# Patient Record
Sex: Female | Born: 1995 | Hispanic: No | Marital: Single | Smoking: Never smoker
Health system: Southern US, Community
[De-identification: ages and names within clinical notes are randomized; demographics above are authoritative.]

## PROBLEM LIST (undated history)

## (undated) HISTORY — PX: MOUTH SURGERY: SHX715

---
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
## 2016-05-16 ENCOUNTER — Emergency Department (HOSPITAL_COMMUNITY)
Admission: EM | Admit: 2016-05-16 | Discharge: 2016-05-17 | Disposition: A | Discharge status: Home / Self Care | Payer: Medicaid Other | Attending: Emergency Medicine | Admitting: Emergency Medicine

## 2016-05-16 ENCOUNTER — Encounter (HOSPITAL_COMMUNITY): Payer: Self-pay | Admitting: *Deleted

## 2016-05-16 DIAGNOSIS — R110 Nausea: Secondary | ICD-10-CM | POA: Insufficient documentation

## 2016-05-16 DIAGNOSIS — R519 Headache, unspecified: Secondary | ICD-10-CM

## 2016-05-16 DIAGNOSIS — R51 Headache: Secondary | ICD-10-CM | POA: Diagnosis present

## 2016-05-16 DIAGNOSIS — Z79899 Other long term (current) drug therapy: Secondary | ICD-10-CM | POA: Diagnosis not present

## 2016-05-16 MED ORDER — ONDANSETRON HCL 4 MG/2ML IJ SOLN
4.0000 mg | Freq: Once | INTRAMUSCULAR | Status: AC
  Administered 2016-05-17: 4 mg via INTRAVENOUS
  Filled 2016-05-16: qty 2

## 2016-05-16 MED ORDER — SODIUM CHLORIDE 0.9 % IV BOLUS (SEPSIS)
1000.0000 mL | Freq: Once | INTRAVENOUS | Status: AC
  Administered 2016-05-17: 1000 mL via INTRAVENOUS

## 2016-05-16 NOTE — ED Notes (Signed)
Pt complains of a frontal headache that started about 2 weeks ago. Had nausea yesterday. Denies blurry vision, dizziness, or vomiting. Took Tylenol yesterday for pain but has not helped.

## 2016-05-16 NOTE — ED Notes (Signed)
Per pt report: pt presents to the ED with c/o a headache x 2 weeks.  Pt reports the headaches come suddenly and the pain mainly stays in the top middle of her forehead.  Pt reports some nausea a few days ago but not at this time. Pt has some light sensitively. Pt rates pain 10 out of 10.

## 2016-05-16 NOTE — ED Provider Notes (Signed)
CSN: [id number]     Arrival date & time 05/16/16  2318 History  By signing my name below, I, Levon HedgerElizabeth Hall, attest that this documentation has been prepared under the direction and in the presence of non-physician practitioner, Dorthula Matasiffany G Lamon Rotundo, PA-C Electronically Signed: Levon HedgerElizabeth Hall, Scribe. 05/17/2016. 12:28 AM.    Chief Complaint  Patient presents with  . Headache   The history is provided by the patient. No language interpreter was used.  HPI Comments:  Paula Rangel is a 20 y.o. female who presents to the Emergency Department complaining of sudden onset, intermittent, sharp, pounding frontal headache onset 2 weeks ago. Pt rates her headache as 9/10. She states the headaches occurs suddenly typically at night. She states it is rare for her to have headaches in the morning. She states she has taken tylenol with no relief. Pt states she has had headaches before but they have never been this severe. Pt also complains of associated photophobia and nausea.  Per pt, she is not experiencing any stress lately and does not work with computers or bright lights. She denies any blurry vision, fever, neck pain, vomiting, numbness, tingling, weakness, or appetite change.Unaware of any diet changes or decrease/increase in caffeine.  History reviewed. No pertinent past medical history. Past Surgical History  Procedure Laterality Date  . Mouth surgery     No family history on file. Social History  Substance Use Topics  . Smoking status: Never Smoker   . Smokeless tobacco: None  . Alcohol Use: No   OB History    No data available     Review of Systems  Constitutional: Negative for fever and appetite change.  Eyes: Positive for photophobia. Negative for visual disturbance.  Gastrointestinal: Positive for nausea. Negative for vomiting.  Musculoskeletal: Negative for neck pain.  Neurological: Positive for headaches. Negative for weakness and numbness.  All other systems reviewed and are  negative.   Allergies  Review of patient's allergies indicates no known allergies.  Home Medications   Prior to Admission medications   Medication Sig Start Date End Date Taking? Authorizing Provider  acetaminophen (TYLENOL) 500 MG tablet Take 1,000 mg by mouth every 6 (six) hours as needed (for pain.).   Yes Historical Provider, MD  clindamycin-benzoyl peroxide (BENZACLIN) gel Apply topically 2 (two) times daily. Patient not taking: Reported on 05/16/2016 10/21/15   Myra RudeJeremy E Schmitz, MD  traMADol (ULTRAM) 50 MG tablet Take 1 tablet (50 mg total) by mouth every 6 (six) hours as needed. 05/17/16   Aidan Caloca Neva SeatGreene, PA-C   BP 118/76 mmHg  Pulse 72  Temp(Src) 98.4 F (36.9 C) (Oral)  Resp 16  Ht 5\' 6"  (1.676 m)  Wt 74.844 kg  BMI 26.64 kg/m2  SpO2 100%  LMP 05/09/2016 Physical Exam  Constitutional: She is oriented to person, place, and time. She appears well-developed and well-nourished. No distress.  HENT:  Head: Normocephalic and atraumatic.  Right Ear: Tympanic membrane and ear canal normal.  Left Ear: Tympanic membrane and ear canal normal.  Nose: Nose normal.  Mouth/Throat: Uvula is midline and oropharynx is clear and moist.  Eyes: Conjunctivae are normal.  Cardiovascular: Normal rate.   Pulmonary/Chest: Effort normal.  Abdominal: She exhibits no distension.  Neurological: She is alert and oriented to person, place, and time.  Cranial nerves grossly intact on exam. Pt alert and oriented x 3 Upper and lower extremity strength is symmetrical and physiologic Normal muscular tone No facial droop Coordination intact, no limb ataxia,  Skin: Skin  is warm and dry.  Psychiatric: She has a normal mood and affect.  Nursing note and vitals reviewed.   ED Course  Procedures  DIAGNOSTIC STUDIES:  Oxygen Saturation is 100% on RA, normal by my interpretation.    COORDINATION OF CARE:  11:48 PM Will order head CT, Toradol, Zofran, and benadryl. Discussed treatment plan with pt at  bedside and pt agreed to plan.  Labs Review Labs Reviewed - No data to display  Imaging Review Ct Head Wo Contrast  05/17/2016  CLINICAL DATA:  Subacute onset of frontal headaches, photophobia and nausea. Initial encounter. EXAM: CT HEAD WITHOUT CONTRAST TECHNIQUE: Contiguous axial images were obtained from the base of the skull through the vertex without intravenous contrast. COMPARISON:  None. FINDINGS: There is no evidence of acute infarction, mass lesion, or intra- or extra-axial hemorrhage on CT. The posterior fossa, including the cerebellum, brainstem and fourth ventricle, is within normal limits. The third and lateral ventricles, and basal ganglia are unremarkable in appearance. The cerebral hemispheres are symmetric in appearance, with normal gray-white differentiation. No mass effect or midline shift is seen. There is no evidence of fracture; visualized osseous structures are unremarkable in appearance. The orbits are within normal limits. The paranasal sinuses and mastoid air cells are well-aerated. No significant soft tissue abnormalities are seen. IMPRESSION: Unremarkable noncontrast CT of the head. Electronically Signed   By: Roanna RaiderJeffery  Chang M.D.   On: 05/17/2016 01:46   I have personally reviewed and evaluated these images and lab results as part of my medical decision-making.   EKG Interpretation None      MDM   Final diagnoses:  Nonintractable headache, unspecified chronicity pattern, unspecified headache type    Pt HA treated and improved while in ED. CT of the head is unremarkable.  Presentation is like pts typical HA and non concerning [facility], ICH, Meningitis, or temporal arteritis. Pt is afebrile with no focal neuro deficits, nuchal rigidity, or change in vision. Pt is to follow up with PCP to discuss prophylactic medication. Pt verbalizes understanding and is agreeable with plan to dc.   I personally performed the services described in this documentation, which was scribed  in my presence. The recorded information has been reviewed and is accurate.   Marlon Peliffany Eliot Popper, PA-C 05/17/16 0214  Eber HongBrian Miller, MD 05/17/16 [phone]

## 2016-05-17 ENCOUNTER — Emergency Department (HOSPITAL_COMMUNITY): Payer: Medicaid Other

## 2016-05-17 MED ORDER — KETOROLAC TROMETHAMINE 30 MG/ML IJ SOLN
30.0000 mg | Freq: Once | INTRAMUSCULAR | Status: AC
  Administered 2016-05-17: 30 mg via INTRAVENOUS
  Filled 2016-05-17: qty 1

## 2016-05-17 MED ORDER — DIPHENHYDRAMINE HCL 50 MG/ML IJ SOLN
25.0000 mg | Freq: Once | INTRAMUSCULAR | Status: AC
  Administered 2016-05-17: 25 mg via INTRAVENOUS
  Filled 2016-05-17: qty 1

## 2016-05-17 MED ORDER — TRAMADOL HCL 50 MG PO TABS
50.0000 mg | ORAL_TABLET | Freq: Four times a day (QID) | ORAL | Status: AC | PRN
Start: 2016-05-17 — End: ?

## 2016-05-17 NOTE — Discharge Instructions (Signed)

## 2016-10-22 IMAGING — CT CT HEAD W/O CM
3 of 4 series · 15 of 47 positions shown, 18 images · non-contrast
Comparison: None.

CLINICAL DATA: Subacute onset of frontal headaches, photophobia and
nausea. Initial encounter.

EXAM:
CT HEAD WITHOUT CONTRAST
TECHNIQUE: Contiguous axial images were obtained from the base of the skull
through the vertex without intravenous contrast.

[Series 2: head w/o · axial · non-contrast · 0.45mm/px · z∈[-49,+80]mm · 9 of 32 slices shown, 12 images]
[im 3/32  brain]
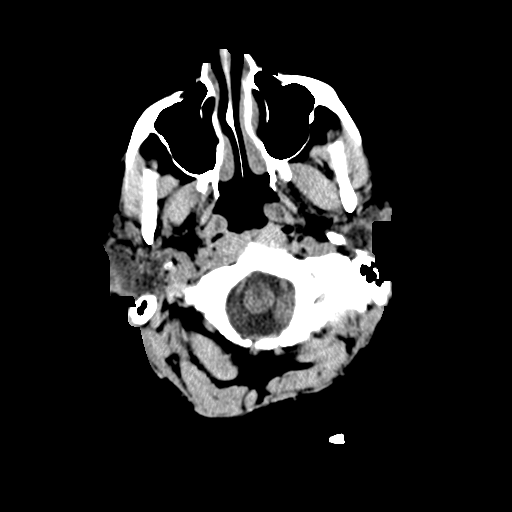
[im 3/32  bone]
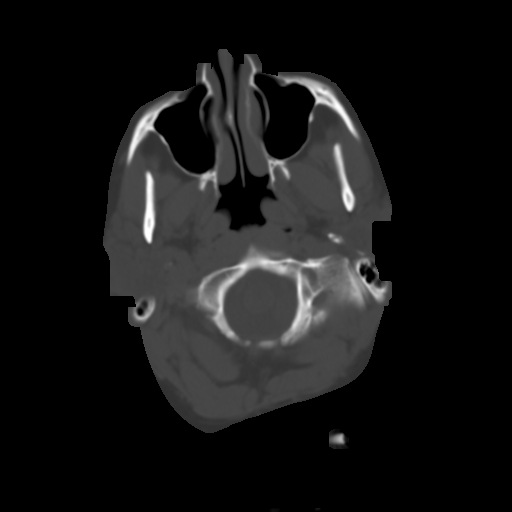
[im 7/32  brain]
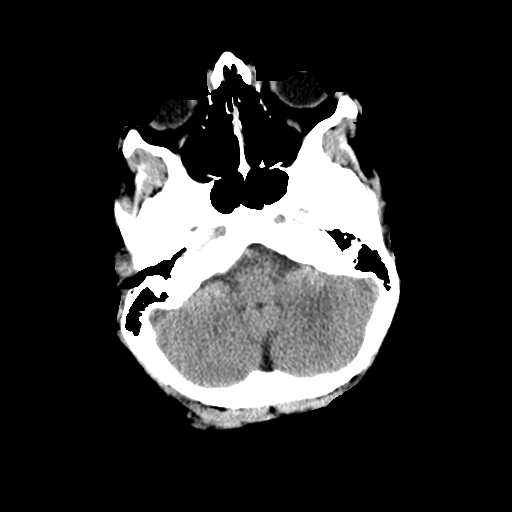
[im 9/32  brain]
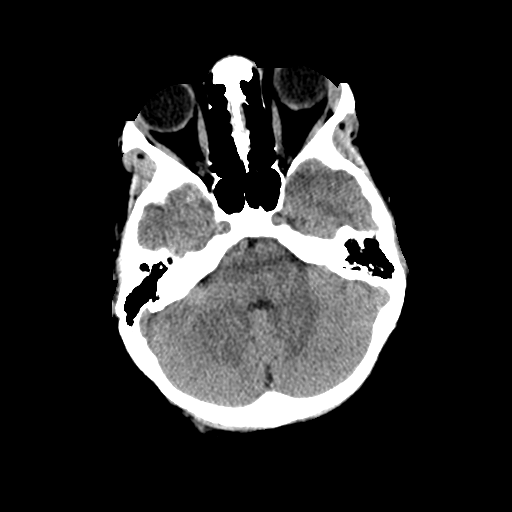
[im 14/32  brain]
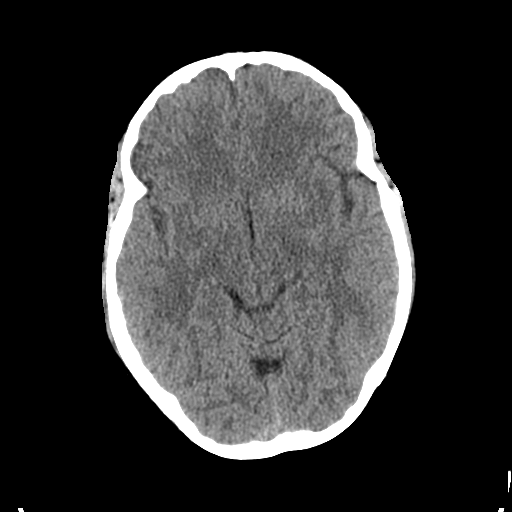
[im 16/32  brain]
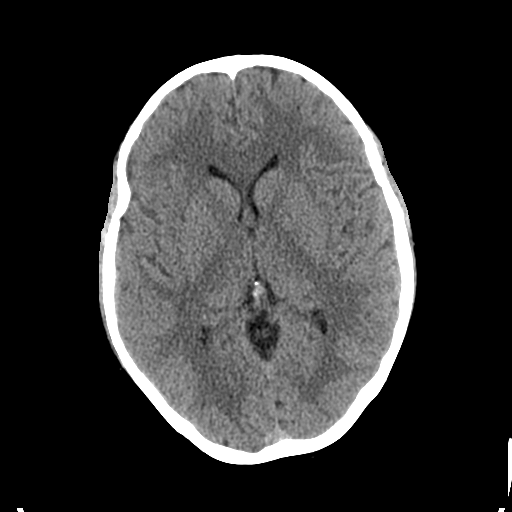
[im 16/32  bone]
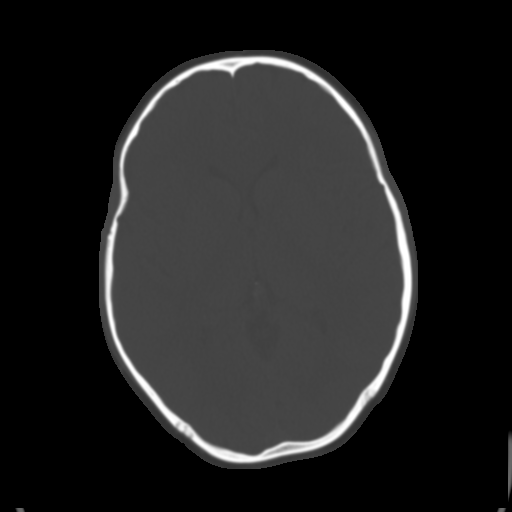
[im 18/32  brain]
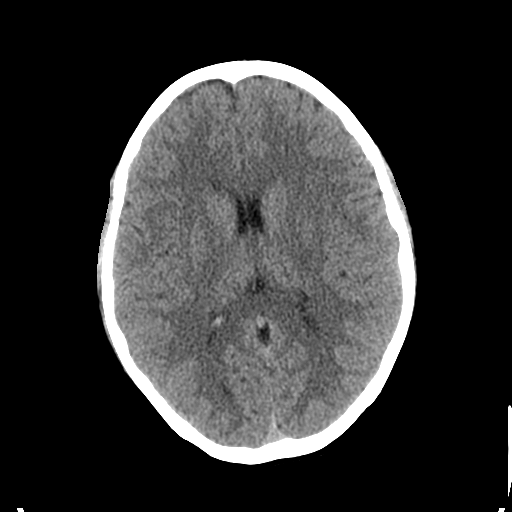
[im 23/32  brain]
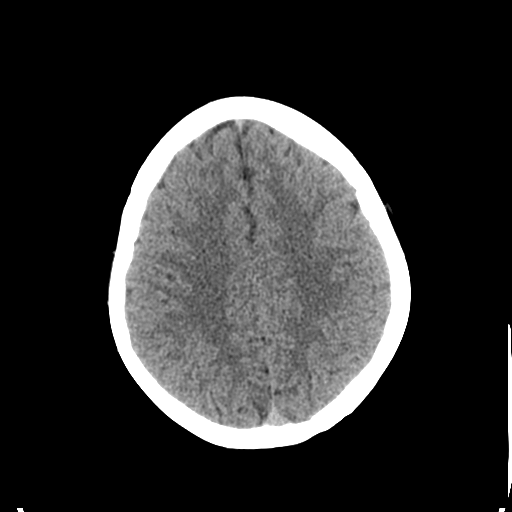
[im 25/32  brain]
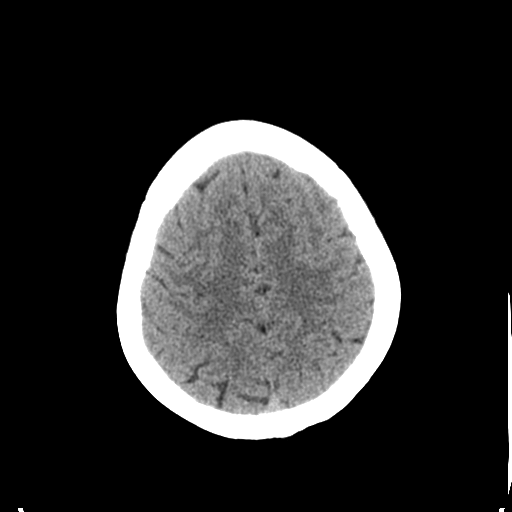
[im 29/32  brain]
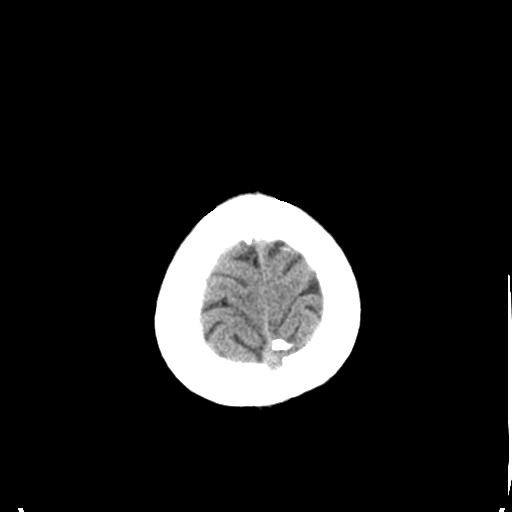
[im 29/32  bone]
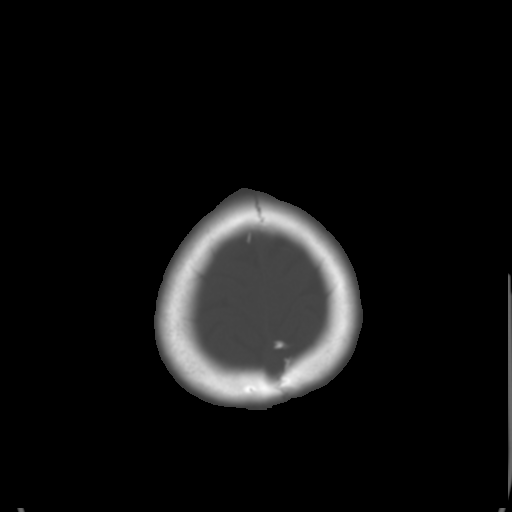

[Series 4: coronal · coronal · 0.29mm/px · 3 of 62 slices shown]
[im 21/62  brain]
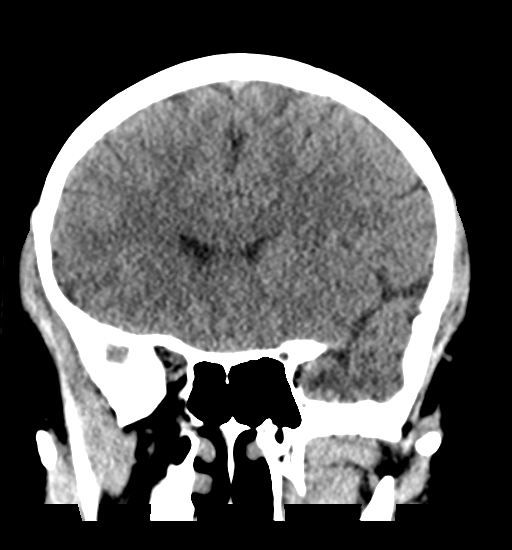
[im 28/62  brain]
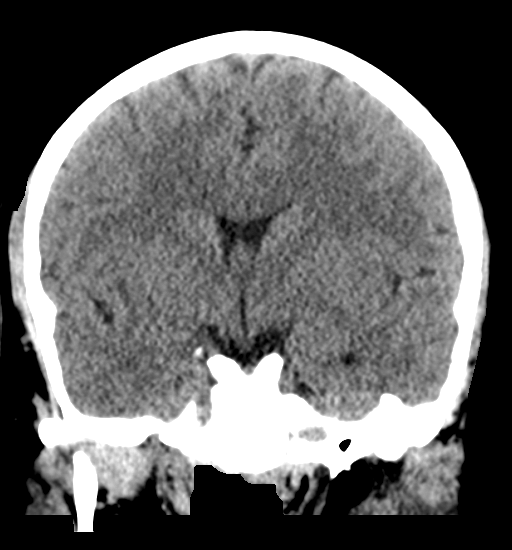
[im 34/62  brain]
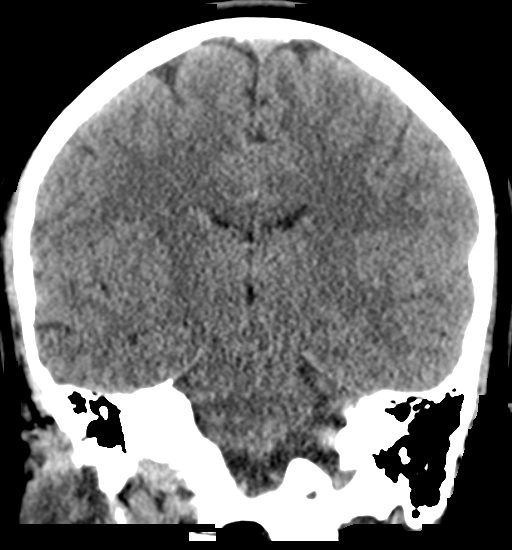

[Series 5: sagittal · sagittal · 0.30mm/px · 3 of 49 slices shown]
[im 17/49  brain]
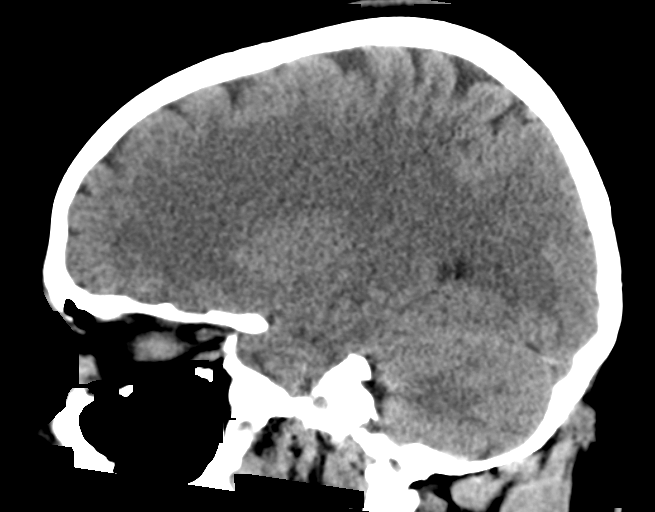
[im 25/49  brain]
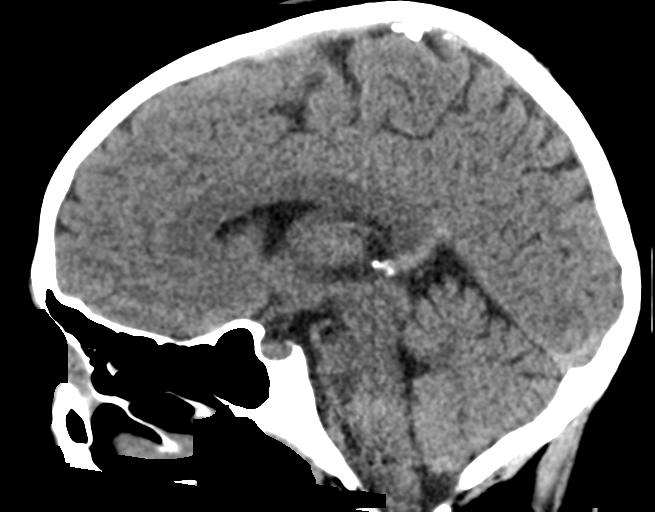
[im 33/49  brain]
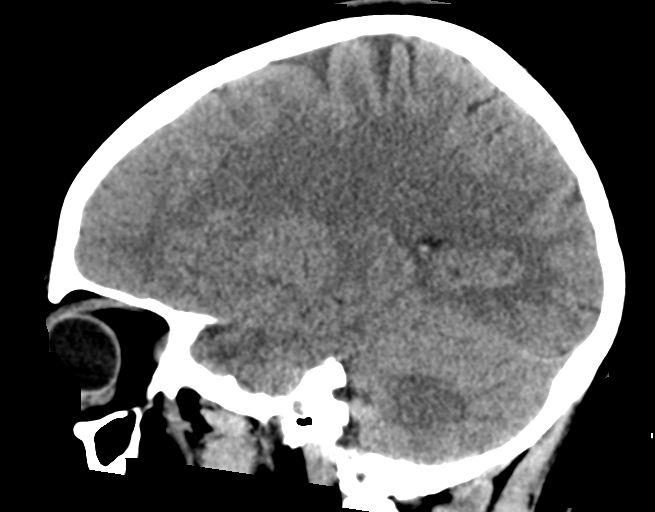

[15 of 47 positions shown; findings below may reference images not displayed]

FINDINGS: There is no evidence of acute infarction, mass lesion, or intra- or
extra-axial hemorrhage on CT.

The posterior fossa, including the cerebellum, brainstem and fourth
ventricle, is within normal limits. The third and lateral
ventricles, and basal ganglia are unremarkable in appearance. The
cerebral hemispheres are symmetric in appearance, with normal
gray-white differentiation. No mass effect or midline shift is seen.

There is no evidence of fracture; visualized osseous structures are
unremarkable in appearance. The orbits are within normal limits. The
paranasal sinuses and mastoid air cells are well-aerated. No
significant soft tissue abnormalities are seen.
IMPRESSION: Unremarkable noncontrast CT of the head.
